# Patient Record
Sex: Female | Born: 1990 | Race: White | Hispanic: No | Marital: Single | Smoking: Current every day smoker
Health system: Southern US, Community
[De-identification: ages and names within clinical notes are randomized; demographics above are authoritative.]

## PROBLEM LIST (undated history)

## (undated) DIAGNOSIS — D649 Anemia, unspecified: Secondary | ICD-10-CM

---

## 2015-12-19 ENCOUNTER — Observation Stay (HOSPITAL_BASED_OUTPATIENT_CLINIC_OR_DEPARTMENT_OTHER)
Admission: EM | Admit: 2015-12-19 | Discharge: 2015-12-21 | DRG: 392 | Disposition: A | Discharge status: Home / Self Care | Payer: BLUE CROSS/BLUE SHIELD | Attending: Internal Medicine | Admitting: Internal Medicine

## 2015-12-19 ENCOUNTER — Encounter (HOSPITAL_BASED_OUTPATIENT_CLINIC_OR_DEPARTMENT_OTHER): Payer: Self-pay | Admitting: *Deleted

## 2015-12-19 ENCOUNTER — Emergency Department (HOSPITAL_BASED_OUTPATIENT_CLINIC_OR_DEPARTMENT_OTHER): Payer: BLUE CROSS/BLUE SHIELD

## 2015-12-19 DIAGNOSIS — N83201 Unspecified ovarian cyst, right side: Secondary | ICD-10-CM | POA: Diagnosis present

## 2015-12-19 DIAGNOSIS — F17200 Nicotine dependence, unspecified, uncomplicated: Secondary | ICD-10-CM | POA: Diagnosis present

## 2015-12-19 DIAGNOSIS — E876 Hypokalemia: Secondary | ICD-10-CM | POA: Diagnosis present

## 2015-12-19 DIAGNOSIS — N920 Excessive and frequent menstruation with regular cycle: Secondary | ICD-10-CM | POA: Diagnosis not present

## 2015-12-19 DIAGNOSIS — N390 Urinary tract infection, site not specified: Secondary | ICD-10-CM | POA: Diagnosis not present

## 2015-12-19 DIAGNOSIS — M545 Low back pain: Secondary | ICD-10-CM | POA: Diagnosis not present

## 2015-12-19 DIAGNOSIS — R109 Unspecified abdominal pain: Secondary | ICD-10-CM

## 2015-12-19 DIAGNOSIS — D509 Iron deficiency anemia, unspecified: Secondary | ICD-10-CM | POA: Diagnosis present

## 2015-12-19 DIAGNOSIS — D649 Anemia, unspecified: Secondary | ICD-10-CM

## 2015-12-19 DIAGNOSIS — K529 Noninfective gastroenteritis and colitis, unspecified: Principal | ICD-10-CM | POA: Diagnosis present

## 2015-12-19 DIAGNOSIS — K8020 Calculus of gallbladder without cholecystitis without obstruction: Secondary | ICD-10-CM | POA: Diagnosis present

## 2015-12-19 DIAGNOSIS — R1030 Lower abdominal pain, unspecified: Secondary | ICD-10-CM

## 2015-12-19 HISTORY — DX: Anemia, unspecified: D64.9

## 2015-12-19 LAB — CBC WITH DIFFERENTIAL/PLATELET
BASOS ABS: 0 10*3/uL (ref 0.0–0.1)
Basophils Relative: 0 %
EOS ABS: 0.1 10*3/uL (ref 0.0–0.7)
Eosinophils Relative: 1 %
HCT: 24.3 % — ABNORMAL LOW (ref 36.0–46.0)
HEMOGLOBIN: 6.4 g/dL — AB (ref 12.0–15.0)
LYMPHS ABS: 1.1 10*3/uL (ref 0.7–4.0)
Lymphocytes Relative: 15 %
MCH: 15.3 pg — AB (ref 26.0–34.0)
MCHC: 26.3 g/dL — AB (ref 30.0–36.0)
MCV: 58 fL — ABNORMAL LOW (ref 78.0–100.0)
Monocytes Absolute: 0.4 10*3/uL (ref 0.1–1.0)
Monocytes Relative: 6 %
NEUTROS ABS: 5.8 10*3/uL (ref 1.7–7.7)
Neutrophils Relative %: 78 %
PLATELETS: 468 10*3/uL — AB (ref 150–400)
RBC: 4.19 MIL/uL (ref 3.87–5.11)
RDW: 23.1 % — ABNORMAL HIGH (ref 11.5–15.5)
WBC: 7.4 10*3/uL (ref 4.0–10.5)

## 2015-12-19 LAB — COMPREHENSIVE METABOLIC PANEL
ALK PHOS: 44 U/L (ref 38–126)
ALT: 11 U/L — ABNORMAL LOW (ref 14–54)
ANION GAP: 11 (ref 5–15)
AST: 18 U/L (ref 15–41)
Albumin: 4.7 g/dL (ref 3.5–5.0)
BUN: 11 mg/dL (ref 6–20)
CALCIUM: 9.3 mg/dL (ref 8.9–10.3)
CO2: 26 mmol/L (ref 22–32)
Chloride: 100 mmol/L — ABNORMAL LOW (ref 101–111)
Creatinine, Ser: 0.57 mg/dL (ref 0.44–1.00)
Glucose, Bld: 103 mg/dL — ABNORMAL HIGH (ref 65–99)
Potassium: 2.9 mmol/L — ABNORMAL LOW (ref 3.5–5.1)
SODIUM: 137 mmol/L (ref 135–145)
Total Bilirubin: 0.5 mg/dL (ref 0.3–1.2)
Total Protein: 7.6 g/dL (ref 6.5–8.1)

## 2015-12-19 LAB — URINALYSIS, MICROSCOPIC (REFLEX)

## 2015-12-19 LAB — PREGNANCY, URINE: PREG TEST UR: NEGATIVE

## 2015-12-19 LAB — URINALYSIS, ROUTINE W REFLEX MICROSCOPIC
BILIRUBIN URINE: NEGATIVE
Glucose, UA: NEGATIVE mg/dL
HGB URINE DIPSTICK: NEGATIVE
KETONES UR: NEGATIVE mg/dL
Nitrite: NEGATIVE
PROTEIN: 30 mg/dL — AB
Specific Gravity, Urine: 1.026 (ref 1.005–1.030)
pH: 6 (ref 5.0–8.0)

## 2015-12-19 LAB — LIPASE, BLOOD: LIPASE: 15 U/L (ref 11–51)

## 2015-12-19 MED ORDER — ONDANSETRON HCL 4 MG/2ML IJ SOLN
4.0000 mg | Freq: Once | INTRAMUSCULAR | Status: DC
  Filled 2015-12-19: qty 2

## 2015-12-19 MED ORDER — DEXTROSE 5 % IV SOLN
1.0000 g | Freq: Once | INTRAVENOUS | Status: AC
  Administered 2015-12-19: 1 g via INTRAVENOUS
  Filled 2015-12-19: qty 10

## 2015-12-19 MED ORDER — MORPHINE SULFATE (PF) 4 MG/ML IV SOLN
4.0000 mg | Freq: Once | INTRAVENOUS | Status: AC
  Administered 2015-12-19: 4 mg via INTRAVENOUS
  Filled 2015-12-19: qty 1

## 2015-12-19 MED ORDER — ONDANSETRON 8 MG PO TBDP
8.0000 mg | ORAL_TABLET | Freq: Once | ORAL | Status: AC
  Administered 2015-12-19: 8 mg via ORAL

## 2015-12-19 MED ORDER — POTASSIUM CHLORIDE CRYS ER 20 MEQ PO TBCR
40.0000 meq | EXTENDED_RELEASE_TABLET | Freq: Once | ORAL | Status: AC
  Administered 2015-12-19: 40 meq via ORAL
  Filled 2015-12-19: qty 2

## 2015-12-19 MED ORDER — SODIUM CHLORIDE 0.9 % IV BOLUS (SEPSIS)
1000.0000 mL | Freq: Once | INTRAVENOUS | Status: AC
  Administered 2015-12-19: 1000 mL via INTRAVENOUS

## 2015-12-19 MED ORDER — ONDANSETRON 8 MG PO TBDP
ORAL_TABLET | ORAL | Status: AC
  Filled 2015-12-19: qty 1

## 2015-12-19 NOTE — ED Provider Notes (Signed)
[facility] Provider Note   CSN: [id number] Arrival date & time: 12/19/15  2137 By signing my name below, I, Bridgette HabermannMaria Tan, attest that this documentation has been prepared under the direction and in the presence of Linwood DibblesJon Kobe Jansma, MD. Electronically Signed: Bridgette HabermannMaria Tan, ED Scribe. 12/19/15. 10:29 PM.  History   Chief Complaint Chief Complaint  Patient presents with  . Abdominal Pain   HPI Comments: Kelsey Park is a 25 y.o. female with h/o anemia who presents to the Emergency Department complaining of abdominal pain onset 4 days ago with associated nausea, vomiting, and lower back pain. Pt states she had diarrhea the first two days and notes that it has resolved at this time. Pain is exacerbated with movement and improved when lying still. Pt took an Advil 200mg  one hour ago with minimal relief. Denies h/o similar symptoms or any suspicious food activity. Pt denies vaginal bleeding/discharge, dysuria, hematuria, fever, chills, or any other associated symptoms.   The history is provided by the patient. No language interpreter was used.    Past Medical History:  Diagnosis Date  . Anemia     There are no active problems to display for this patient.   History reviewed. No pertinent surgical history.  OB History    No data available       Home Medications    Prior to Admission medications   Not on File    Family History History reviewed. No pertinent family history.  Social History Social History  Substance Use Topics  . Smoking status: Current Every Day Smoker  . Smokeless tobacco: Never Used  . Alcohol use Yes     Allergies   Patient has no known allergies.   Review of Systems Review of Systems  Constitutional: Negative for chills and fever.  Gastrointestinal: Positive for abdominal pain, nausea and vomiting. Negative for diarrhea.  Genitourinary: Positive for menstrual problem (heavy menses history). Negative for dysuria and hematuria.  Musculoskeletal:  Positive for back pain.  All other systems reviewed and are negative.    Physical Exam Updated Vital Signs BP 133/75 (BP Location: Left Arm)   Pulse 80   Temp 98 F (36.7 C) (Oral)   Resp 18   Ht 5' 3.5" (1.613 m)   Wt 46.6 kg   LMP 11/24/2015 (Approximate)   SpO2 100%   BMI 17.92 kg/m   Physical Exam  Constitutional: She appears well-developed and well-nourished. No distress.  HENT:  Head: Normocephalic and atraumatic.  Right Ear: External ear normal.  Left Ear: External ear normal.  Eyes: Conjunctivae are normal. Right eye exhibits no discharge. Left eye exhibits no discharge. No scleral icterus.  Neck: Neck supple. No tracheal deviation present.  Cardiovascular: Normal rate, regular rhythm and intact distal pulses.   Pulmonary/Chest: Effort normal and breath sounds normal. No stridor. No respiratory distress. She has no wheezes. She has no rales.  Abdominal: Soft. Bowel sounds are normal. She exhibits no distension. There is tenderness in the left lower quadrant. There is guarding. There is no rebound.  Musculoskeletal: She exhibits no edema or tenderness.  Neurological: She is alert. She has normal strength. No cranial nerve deficit (no facial droop, extraocular movements intact, no slurred speech) or sensory deficit. She exhibits normal muscle tone. She displays no seizure activity. Coordination normal.  Skin: Skin is warm and dry. No rash noted. There is pallor.  Psychiatric: She has a normal mood and affect.  Nursing note and vitals reviewed.    ED Treatments / Results  DIAGNOSTIC STUDIES: Oxygen Saturation is 100% on RA, normal by my interpretation.    COORDINATION OF CARE: 10:29 PM Discussed treatment plan with pt at bedside which includes lab work and IV fluids and pt agreed to plan.  Labs (all labs ordered are listed, but only abnormal results are displayed) Labs Reviewed  URINALYSIS, ROUTINE W REFLEX MICROSCOPIC - Abnormal; Notable for the following:        Result Value   Color, Urine AMBER (*)    APPearance CLOUDY (*)    Protein, ur 30 (*)    Leukocytes, UA LARGE (*)    All other components within normal limits  CBC WITH DIFFERENTIAL/PLATELET - Abnormal; Notable for the following:    Hemoglobin 6.4 (*)    HCT 24.3 (*)    MCV 58.0 (*)    MCH 15.3 (*)    MCHC 26.3 (*)    RDW 23.1 (*)    Platelets 468 (*)    All other components within normal limits  COMPREHENSIVE METABOLIC PANEL - Abnormal; Notable for the following:    Potassium 2.9 (*)    Chloride 100 (*)    Glucose, Bld 103 (*)    ALT 11 (*)    All other components within normal limits  URINALYSIS, MICROSCOPIC (REFLEX) - Abnormal; Notable for the following:    Bacteria, UA FEW (*)    Squamous Epithelial / LPF 0-5 (*)    All other components within normal limits  PREGNANCY, URINE  LIPASE, BLOOD  OCCULT BLOOD X 1 CARD TO LAB, STOOL    EKG  EKG Interpretation None       Radiology No results found.  Procedures Procedures (including critical care time)  Medications Ordered in ED Medications  ondansetron (ZOFRAN) injection 4 mg (not administered)  potassium chloride SA (K-DUR,KLOR-CON) CR tablet 40 mEq (not administered)  cefTRIAXone (ROCEPHIN) 1 g in dextrose 5 % 50 mL IVPB (not administered)  ondansetron (ZOFRAN-ODT) disintegrating tablet 8 mg (8 mg Oral Given 12/19/15 2209)  sodium chloride 0.9 % bolus 1,000 mL (1,000 mLs Intravenous New Bag/Given 12/19/15 2246)  morphine 4 MG/ML injection 4 mg (4 mg Intravenous Given 12/19/15 2246)     Initial Impression / Assessment and Plan / ED Course  I have reviewed the triage vital signs and the nursing notes.  Pertinent labs & imaging results that were available during my care of the patient were reviewed by me and considered in my medical decision making (see chart for details).  Clinical Course as of Dec 18 2348  Wynelle LinkSun Dec 19, 2015  2319 Labs show a significant anemia.  Pt has been told she has anemia and is supposed  to be taking iron but has not because of the side effects.  [JK]  2330 Pt has had hemoglobins as low as 6 in the past.  She has received blood transfusions because of this.  [JK]    Clinical Course User Index [JK] Linwood DibblesJon Raha Tennison, MD    Pt has profound anemia. Suspect this is more chronic but with her abdominal pain will CT to evaluate for acute pathology/hemorrhage/stone.    UTI also noted which may be her source of abdominal pain.     Will check fecal occult.  Replace oral potassium.  Will likely need transfer for admission, blood transfusion.  Case turned over to Dr Bebe ShaggyWickline  Final Clinical Impressions(s) / ED Diagnoses   Final diagnoses:  Anemia, unspecified type  Hypokalemia  Urinary tract infection without hematuria, site unspecified   I  personally performed the services described in this documentation, which was scribed in my presence.  The recorded information has been reviewed and is accurate.    Linwood Dibbles, MD 12/19/15 [phone]

## 2015-12-19 NOTE — ED Notes (Signed)
MD does not want any blood cultures to be collected at this time.

## 2015-12-19 NOTE — ED Triage Notes (Addendum)
C/o abd pain, onset Thursday, (denies: urinary or vaginal sx, or fever), also reports nvd and some constipatiton, last BM 2d ago, vomiting x4 in last 24 hrs, diarrhea resolved. Took advil 200mg  1 hr ago. Also mentions back pain.

## 2015-12-19 NOTE — ED Notes (Signed)
MD aware Hgb is 6.4

## 2015-12-20 ENCOUNTER — Observation Stay (HOSPITAL_COMMUNITY): Payer: BLUE CROSS/BLUE SHIELD

## 2015-12-20 ENCOUNTER — Encounter (HOSPITAL_COMMUNITY): Payer: Self-pay | Admitting: Internal Medicine

## 2015-12-20 DIAGNOSIS — D509 Iron deficiency anemia, unspecified: Secondary | ICD-10-CM | POA: Diagnosis not present

## 2015-12-20 DIAGNOSIS — E876 Hypokalemia: Secondary | ICD-10-CM

## 2015-12-20 DIAGNOSIS — D649 Anemia, unspecified: Secondary | ICD-10-CM

## 2015-12-20 DIAGNOSIS — K529 Noninfective gastroenteritis and colitis, unspecified: Principal | ICD-10-CM

## 2015-12-20 LAB — BASIC METABOLIC PANEL
Anion gap: 7 (ref 5–15)
BUN: 5 mg/dL — AB (ref 6–20)
CALCIUM: 8.3 mg/dL — AB (ref 8.9–10.3)
CO2: 25 mmol/L (ref 22–32)
CREATININE: 0.57 mg/dL (ref 0.44–1.00)
Chloride: 104 mmol/L (ref 101–111)
Glucose, Bld: 91 mg/dL (ref 65–99)
Potassium: 4 mmol/L (ref 3.5–5.1)
SODIUM: 136 mmol/L (ref 135–145)

## 2015-12-20 LAB — IRON AND TIBC
Iron: 6 ug/dL — ABNORMAL LOW (ref 28–170)
SATURATION RATIOS: 1 % — AB (ref 10.4–31.8)
TIBC: 433 ug/dL (ref 250–450)
UIBC: 427 ug/dL

## 2015-12-20 LAB — CBC
HEMATOCRIT: 22.2 % — AB (ref 36.0–46.0)
MCH: 15.2 pg — AB (ref 26.0–34.0)
MCHC: 25.7 g/dL — ABNORMAL LOW (ref 30.0–36.0)
MCV: 59 fL — AB (ref 78.0–100.0)
Platelets: 306 10*3/uL (ref 150–400)
RBC: 3.76 MIL/uL — AB (ref 3.87–5.11)
RDW: 22.1 % — ABNORMAL HIGH (ref 11.5–15.5)
WBC: 6.7 10*3/uL (ref 4.0–10.5)

## 2015-12-20 LAB — FERRITIN: Ferritin: 1 ng/mL — ABNORMAL LOW (ref 11–307)

## 2015-12-20 LAB — VITAMIN B12: VITAMIN B 12: 328 pg/mL (ref 180–914)

## 2015-12-20 LAB — ABO/RH: ABO/RH(D): A POS

## 2015-12-20 LAB — RETICULOCYTES
RBC.: 3.83 MIL/uL — AB (ref 3.87–5.11)
RETIC CT PCT: 0.6 % (ref 0.4–3.1)
Retic Count, Absolute: 23 10*3/uL (ref 19.0–186.0)

## 2015-12-20 LAB — FOLATE: FOLATE: 14.2 ng/mL (ref >5.9)

## 2015-12-20 LAB — PREPARE RBC (CROSSMATCH)

## 2015-12-20 LAB — MAGNESIUM: Magnesium: 2.1 mg/dL (ref 1.7–2.4)

## 2015-12-20 MED ORDER — ACETAMINOPHEN 650 MG RE SUPP: 650.0000 mg | Freq: Four times a day (QID) | RECTAL | Status: DC | PRN

## 2015-12-20 MED ORDER — POTASSIUM CHLORIDE CRYS ER 20 MEQ PO TBCR
40.0000 meq | EXTENDED_RELEASE_TABLET | Freq: Once | ORAL | Status: AC
  Administered 2015-12-20: 40 meq via ORAL
  Filled 2015-12-20: qty 2

## 2015-12-20 MED ORDER — MORPHINE SULFATE (PF) 2 MG/ML IV SOLN
2.0000 mg | Freq: Once | INTRAVENOUS | Status: AC
  Administered 2015-12-20: 2 mg via INTRAVENOUS
  Filled 2015-12-20: qty 1

## 2015-12-20 MED ORDER — POTASSIUM CHLORIDE IN NACL 20-0.9 MEQ/L-% IV SOLN
INTRAVENOUS | Status: AC
  Administered 2015-12-20 – 2015-12-21 (×2): via INTRAVENOUS
  Filled 2015-12-20 (×2): qty 1000

## 2015-12-20 MED ORDER — ONDANSETRON HCL 4 MG/2ML IJ SOLN: 4.0000 mg | Freq: Four times a day (QID) | INTRAMUSCULAR | Status: DC | PRN

## 2015-12-20 MED ORDER — ONDANSETRON HCL 4 MG PO TABS: 4.0000 mg | ORAL_TABLET | Freq: Four times a day (QID) | ORAL | Status: DC | PRN

## 2015-12-20 MED ORDER — SODIUM CHLORIDE 0.9 % IV SOLN
510.0000 mg | Freq: Once | INTRAVENOUS | Status: AC
  Administered 2015-12-20: 510 mg via INTRAVENOUS
  Filled 2015-12-20: qty 17

## 2015-12-20 MED ORDER — IOPAMIDOL (ISOVUE-300) INJECTION 61%
100.0000 mL | Freq: Once | INTRAVENOUS | Status: AC | PRN
  Administered 2015-12-20: 100 mL via INTRAVENOUS

## 2015-12-20 MED ORDER — HYDROCODONE-ACETAMINOPHEN 5-325 MG PO TABS
2.0000 | ORAL_TABLET | Freq: Once | ORAL | Status: AC
  Administered 2015-12-20: 2 via ORAL
  Filled 2015-12-20: qty 2

## 2015-12-20 MED ORDER — MORPHINE SULFATE (PF) 2 MG/ML IV SOLN
INTRAVENOUS | Status: AC
  Filled 2015-12-20: qty 1

## 2015-12-20 MED ORDER — TECHNETIUM TC 99M MEBROFENIN IV KIT
5.2000 | PACK | Freq: Once | INTRAVENOUS | Status: AC | PRN
  Administered 2015-12-20: 5.2 via INTRAVENOUS

## 2015-12-20 MED ORDER — SODIUM CHLORIDE 0.9 % IV SOLN: Freq: Once | INTRAVENOUS | Status: DC

## 2015-12-20 MED ORDER — PIPERACILLIN-TAZOBACTAM 3.375 G IVPB
3.3750 g | Freq: Three times a day (TID) | INTRAVENOUS | Status: DC
  Administered 2015-12-20 – 2015-12-21 (×3): 3.375 g via INTRAVENOUS
  Filled 2015-12-20 (×6): qty 50

## 2015-12-20 MED ORDER — ACETAMINOPHEN 325 MG PO TABS: 650.0000 mg | ORAL_TABLET | Freq: Four times a day (QID) | ORAL | Status: DC | PRN

## 2015-12-20 NOTE — ED Provider Notes (Signed)
Pt stable Plan to perform CT a/p Then will need admission for blood transfusion She has had transfusions previously and she has had no issues previously    Kelsey Rhineonald Arely Tinner, MD 12/20/15 0030

## 2015-12-20 NOTE — ED Notes (Signed)
Discussed with pt orders for hemoccult. Pt states she does not want to have this done at this time.

## 2015-12-20 NOTE — Progress Notes (Signed)
25 yo F with hx anemia presents with abdominal pain, n/v/d and back pain.  Dx'd here with gastroenteritis and cystitis.  Found incidentally to have severe anemia and hypokalemia.  No outside records immediately available, but patient reports history of anemia from menorrhagia.  BP (!) 108/52   Pulse 72   Temp 98 F (36.7 C) (Oral)   Resp 18   Ht 5' 3.5" (1.613 m)   Wt 46.6 kg (102 lb 12.8 oz)   LMP 11/24/2015 (Approximate)   SpO2 100%   BMI 17.92 kg/m   WBC  7.4K, Hgb 6.4 (baseline unknown), severe microcytosis K 2.9 CT shows several nonspecific findings ileitis, ovarian cyst, gallstones without cholecystitis.  Given 1L, K 40 PO, and ceftriaxone for cystitis/UTI.  [facility] asked to observe and give blood, replace K.  To med surg bed.

## 2015-12-20 NOTE — ED Notes (Signed)
Water given per order. 

## 2015-12-20 NOTE — ED Provider Notes (Signed)
Ct results noted Pt stable Will admit D/w dr danford for admission    Kelsey Park WicklZadie Rhineine, MD 12/20/15 [phone]

## 2015-12-20 NOTE — Progress Notes (Signed)
Pharmacy Antibiotic Note  Georgina Karie Mainlandli is a 25 y.o. female admitted on 12/19/2015 with intra-abdominal infection.  Pharmacy has been consulted for Zosyn dosing. Pt here with abdominal pain, N/V/D, back pain. WBC WNL. Renal function good. CT with possible enteritis.   Plan: Zosyn 3.375G IV q8h to be infused over 4 hours Trend WBC, temp, renal function  F/U infectious work-up  Height: 5' 3.5" (161.3 cm) Weight: 102 lb 12.8 oz (46.6 kg) IBW/kg (Calculated) : 53.55  Temp (24hrs), Avg:98.1 F (36.7 C), Min:97.9 F (36.6 C), Max:98.2 F (36.8 C)   Recent Labs Lab 12/19/15 2245  WBC 7.4  CREATININE 0.57    Estimated Creatinine Clearance: 79.1 mL/min (by C-G formula based on SCr of 0.57 mg/dL).    No Known Allergies   Abran DukeLedford, Avalie Oconnor 12/20/2015 5:41 AM

## 2015-12-20 NOTE — Progress Notes (Signed)
PROGRESS NOTE    Kelsey Park  ZOX:[id number] DOB: 11/14/1990 DOA: 12/19/2015 PCP: No primary care provider on file.   Outpatient Specialists:     Brief Narrative:  Kelsey Hansenman Agro is a 25 y.o. female with history of anemia requiring transfusion previously attributed to severe menorrhagia presents to the ER because of worsening abdominal pain with vomiting. Patient states her symptoms started around 4-5 days ago with nausea vomiting abdominal pain and diarrhea. Diarrhea stopped after the first day. Denies using any recent antibiotics or sick contacts. Patient had continued abdominal pain mostly in the left lower quadrant with vomiting. Pain radiated to her back. In the ER patient was found to have hypokalemia and microcytic hypo-chromic anemia with hemoglobin around 6. No old labs to compare. CT scan of the abdomen and pelvis was done which shows enteritis involving the terminal ileum with gallstones and right ovarian hemorrhagic cyst or corpus luteum. Patient is being admitted for further management.    Assessment & Plan:   Principal Problem:   Enteritis Active Problems:   Microcytic hypochromic anemia   Hypokalemia   Abdominal pain with nausea vomiting -  -CT scan shows enteritis involving the terminal ileum.  -infectious etiology versus inflammatory bowel disease- stool studies pending -CT scan shows gallstones- HIDA scan.  -Pain could also be from the right hemorrhagic ovarian cyst.  Hypokalemia probably from vomiting and poor oral intake - replace and recheck.  Microcytic hypochromic anemia -  -IV fe x 1 -transfuse 1 unit PRBC  Tobacco abuse - advised about quitting.   DVT prophylaxis:  SCD's  Code Status: Full Code   Family Communication:   Disposition Plan:     Consultants:        Subjective: No further N/V  Objective: Vitals:   12/20/15 0330 12/20/15 0345 12/20/15 0346 12/20/15 0435  BP: 123/58 121/63 121/63 110/63  Pulse: 71  (!) 58 65  Resp:   20 18    Temp:   98.1 F (36.7 C) 98.2 F (36.8 C)  TempSrc:   Oral Oral  SpO2: 100%  100% 100%  Weight:      Height:        Intake/Output Summary (Last 24 hours) at 12/20/15 1020 Last data filed at 12/20/15 0301  Gross per 24 hour  Intake             1000 ml  Output                0 ml  Net             1000 ml   Filed Weights   12/19/15 2139  Weight: 46.6 kg (102 lb 12.8 oz)    Examination:  General exam: Appears calm and comfortable  Respiratory system: Clear to auscultation. Respiratory effort normal. Cardiovascular system: S1 & S2 heard, RRR. No JVD, murmurs, rubs, gallops or clicks. No pedal edema. Gastrointestinal system: Abdomen is nondistended, soft and nontender. No organomegaly or masses felt. Normal bowel sounds heard. Central nervous system: Alert and oriented. No focal neurological deficits.      Data Reviewed: I have personally reviewed following labs and imaging studies  CBC:  Recent Labs Lab 12/19/15 2245  WBC 7.4  NEUTROABS 5.8  HGB 6.4*  HCT 24.3*  MCV 58.0*  PLT 468*   Basic Metabolic Panel:  Recent Labs Lab 12/19/15 2245 12/20/15 0814  NA 137  --   K 2.9*  --   CL 100*  --   CO2 26  --  GLUCOSE 103*  --   BUN 11  --   CREATININE 0.57  --   CALCIUM 9.3  --   MG  --  2.1   GFR: Estimated Creatinine Clearance: 79.1 mL/min (by C-G formula based on SCr of 0.57 mg/dL). Liver Function Tests:  Recent Labs Lab 12/19/15 2245  AST 18  ALT 11*  ALKPHOS 44  BILITOT 0.5  PROT 7.6  ALBUMIN 4.7    Recent Labs Lab 12/19/15 2245  LIPASE 15   No results for input(s): AMMONIA in the last 168 hours. Coagulation Profile: No results for input(s): INR, PROTIME in the last 168 hours. Cardiac Enzymes: No results for input(s): CKTOTAL, CKMB, CKMBINDEX, TROPONINI in the last 168 hours. BNP (last 3 results) No results for input(s): PROBNP in the last 8760 hours. HbA1C: No results for input(s): HGBA1C in the last 72 hours. CBG: No results  for input(s): GLUCAP in the last 168 hours. Lipid Profile: No results for input(s): CHOL, HDL, LDLCALC, TRIG, CHOLHDL, LDLDIRECT in the last 72 hours. Thyroid Function Tests: No results for input(s): TSH, T4TOTAL, FREET4, T3FREE, THYROIDAB in the last 72 hours. Anemia Panel:  Recent Labs  12/20/15 0814  VITAMINB12 328  FOLATE 14.2  FERRITIN 1*  TIBC 433  IRON 6*  RETICCTPCT 0.6   Urine analysis:    Component Value Date/Time   COLORURINE AMBER (A) 12/19/2015 2230   APPEARANCEUR CLOUDY (A) 12/19/2015 2230   LABSPEC 1.026 12/19/2015 2230   PHURINE 6.0 12/19/2015 2230   GLUCOSEU NEGATIVE 12/19/2015 2230   HGBUR NEGATIVE 12/19/2015 2230   BILIRUBINUR NEGATIVE 12/19/2015 2230   KETONESUR NEGATIVE 12/19/2015 2230   PROTEINUR 30 (A) 12/19/2015 2230   NITRITE NEGATIVE 12/19/2015 2230   LEUKOCYTESUR LARGE (A) 12/19/2015 2230     )No results found for this or any previous visit (from the past 240 hour(s)).    Anti-infectives    Start     Dose/Rate Route Frequency Ordered Stop   12/20/15 0545  piperacillin-tazobactam (ZOSYN) IVPB 3.375 g     3.375 g 12.5 mL/hr over 240 Minutes Intravenous Every 8 hours 12/20/15 0544     12/20/15 0000  cefTRIAXone (ROCEPHIN) 1 g in dextrose 5 % 50 mL IVPB     1 g 100 mL/hr over 30 Minutes Intravenous  Once 12/19/15 2348 12/20/15 0040       Radiology Studies: Ct Abdomen Pelvis W Contrast  Result Date: 12/20/2015 CLINICAL DATA:  Abdominal pain onset 4 days ago with nausea, vomiting and lower back pain. EXAM: CT ABDOMEN AND PELVIS WITH CONTRAST TECHNIQUE: Multidetector CT imaging of the abdomen and pelvis was performed using the standard protocol following bolus administration of intravenous contrast. CONTRAST:  ISOVUE-300 IOPAMIDOL (ISOVUE-300) INJECTION 61% COMPARISON:  None. FINDINGS: LOWER CHEST: Lung bases are clear. Included heart size is normal. No pericardial effusion. HEPATOBILIARY: Mild periportal edema. Trace pericholecystic  fluid without wall thickening. There appear to be 2 tiny gallstones seen on the sagittal view along the dependent aspect, series 6, image 35. No biliary dilatation. PANCREAS: Normal. SPLEEN: Normal. ADRENALS/URINARY TRACT: Kidneys are orthotopic, demonstrating symmetric enhancement. No nephrolithiasis, hydronephrosis or solid renal masses. The unopacified ureters are normal in course and caliber. Delayed imaging through the kidneys demonstrates symmetric prompt contrast excretion within the proximal urinary collecting system. Urinary bladder is partially distended and unremarkable. Normal adrenal glands. STOMACH/BOWEL: The stomach, small and large bowel are normal in course. Mild fluid-filled distal ileal loops may represent mild ileitis/enteritis. Moderate colonic stool burden from cecum  through mid transverse colon. No bowel obstruction. Normal appendix. VASCULAR/LYMPHATIC: Aortoiliac vessels are normal in course and caliber. No lymphadenopathy by CT size criteria. REPRODUCTIVE: 2.4 cm enhancing right-sided follicle or cyst possibly a hemorrhagic cyst or corpus luteum. OTHER: Trace free fluid in the pelvis.  No free air. MUSCULOSKELETAL: Nonacute. IMPRESSION: Mild fluid-filled distention of distal and terminal ileum which may reflect a mild enteritis/ ileitis. Cholelithiasis with trace pericholecystic fluid but without wall thickening. Mild periportal edema. If cholecystitis is of clinical concern, HIDA scan may help for further assessment. Right ovarian hemorrhagic cyst or corpus luteum measuring 2.4 cm with trace free fluid in the pelvis. Electronically Signed   By: Tollie Ethavid  Kwon M.D.   On: 12/20/2015 02:28        Scheduled Meds: . sodium chloride   Intravenous Once  . ferumoxytol  510 mg Intravenous Once  . morphine      . ondansetron  4 mg Intravenous Once  . piperacillin-tazobactam (ZOSYN)  IV  3.375 g Intravenous Q8H   Continuous Infusions: . 0.9 % NaCl with KCl 20 mEq / L 75 mL/hr at 12/20/15  0611     LOS: 0 days    Time spent: 35 min    JESSICA U VANN, DO [facility] Pager [phone]  If 7PM-7AM, please contact night-coverage [email or web address] Password [email or web address] 12/20/2015, 10:20 AM

## 2015-12-20 NOTE — ED Notes (Signed)
Patient transported to CT 

## 2015-12-20 NOTE — H&P (Signed)
History and Physical    Kelsey Park WUJ:[id number] DOB: 03/14/1990 DOA: 12/19/2015  PCP: No primary care provider on file.  Patient coming from: Home.  Chief Complaint: Abdominal pain with nausea vomiting.  HPI: Kelsey Park is a 25 y.o. female with history of anemia requiring transfusion previously attributed to severe menorrhagia presents to the ER because of worsening abdominal pain with vomiting. Patient states her symptoms started around 4-5 days ago with nausea vomiting abdominal pain and diarrhea. Diarrhea stopped after the first day. Denies using any recent antibiotics or sick contacts. Patient had continued abdominal pain mostly in the left lower quadrant with vomiting. Pain radiated to her back. In the ER patient was found to have hypokalemia and microcytic hypo-chromic anemia with hemoglobin around 6. No old labs to compare. CT scan of the abdomen and pelvis was done which shows enteritis involving the terminal ileum with gallstones and right ovarian hemorrhagic cyst or corpus luteum. Patient is being admitted for further management.   ED Course: CT scan as described above. Patient was given potassium replacement.  Review of Systems: As per HPI, rest all negative.   Past Medical History:  Diagnosis Date  . Anemia     History reviewed. No pertinent surgical history.   reports that she has been smoking.  She has never used smokeless tobacco. She reports that she drinks alcohol. She reports that she uses drugs, including Marijuana.  No Known Allergies  Family History  Problem Relation Age of Onset  . Hypertension Mother   . Hyperlipidemia Mother   . Thalassemia Sister     Prior to Admission medications   Not on File    Physical Exam: Vitals:   12/20/15 0330 12/20/15 0345 12/20/15 0346 12/20/15 0435  BP: 123/58 121/63 121/63 110/63  Pulse: 71  (!) 58 65  Resp:   20 18  Temp:   98.1 F (36.7 C) 98.2 F (36.8 C)  TempSrc:   Oral Oral  SpO2: 100%  100% 100%  Weight:       Height:          Constitutional: Moderately built and nourished. Vitals:   12/20/15 0330 12/20/15 0345 12/20/15 0346 12/20/15 0435  BP: 123/58 121/63 121/63 110/63  Pulse: 71  (!) 58 65  Resp:   20 18  Temp:   98.1 F (36.7 C) 98.2 F (36.8 C)  TempSrc:   Oral Oral  SpO2: 100%  100% 100%  Weight:      Height:       Eyes: Anicteric pallor present. ENMT: No discharge from the ears eyes nose and mouth. Neck: No mass no JVD appreciated. Respiratory: No rhonchi or crepitations. Cardiovascular: S1-S2 heard no murmurs appreciated. Abdomen: Soft nontender bowel sounds present no guarding or rigidity. Musculoskeletal: No edema. No joint effusion. Skin: No rash. Skin is warm. Neurologic: Alert awake oriented to time place and person. Moves all extremities. Psychiatric: Appears normal. Normal affect.   Labs on Admission: I have personally reviewed following labs and imaging studies  CBC:  Recent Labs Lab 12/19/15 2245  WBC 7.4  NEUTROABS 5.8  HGB 6.4*  HCT 24.3*  MCV 58.0*  PLT 468*   Basic Metabolic Panel:  Recent Labs Lab 12/19/15 2245  NA 137  K 2.9*  CL 100*  CO2 26  GLUCOSE 103*  BUN 11  CREATININE 0.57  CALCIUM 9.3   GFR: Estimated Creatinine Clearance: 79.1 mL/min (by C-G formula based on SCr of 0.57 mg/dL). Liver Function Tests:  Recent  Labs Lab 12/19/15 2245  AST 18  ALT 11*  ALKPHOS 44  BILITOT 0.5  PROT 7.6  ALBUMIN 4.7    Recent Labs Lab 12/19/15 2245  LIPASE 15   No results for input(s): AMMONIA in the last 168 hours. Coagulation Profile: No results for input(s): INR, PROTIME in the last 168 hours. Cardiac Enzymes: No results for input(s): CKTOTAL, CKMB, CKMBINDEX, TROPONINI in the last 168 hours. BNP (last 3 results) No results for input(s): PROBNP in the last 8760 hours. HbA1C: No results for input(s): HGBA1C in the last 72 hours. CBG: No results for input(s): GLUCAP in the last 168 hours. Lipid Profile: No results for  input(s): CHOL, HDL, LDLCALC, TRIG, CHOLHDL, LDLDIRECT in the last 72 hours. Thyroid Function Tests: No results for input(s): TSH, T4TOTAL, FREET4, T3FREE, THYROIDAB in the last 72 hours. Anemia Panel: No results for input(s): VITAMINB12, FOLATE, FERRITIN, TIBC, IRON, RETICCTPCT in the last 72 hours. Urine analysis:    Component Value Date/Time   COLORURINE AMBER (A) 12/19/2015 2230   APPEARANCEUR CLOUDY (A) 12/19/2015 2230   LABSPEC 1.026 12/19/2015 2230   PHURINE 6.0 12/19/2015 2230   GLUCOSEU NEGATIVE 12/19/2015 2230   HGBUR NEGATIVE 12/19/2015 2230   BILIRUBINUR NEGATIVE 12/19/2015 2230   KETONESUR NEGATIVE 12/19/2015 2230   PROTEINUR 30 (A) 12/19/2015 2230   NITRITE NEGATIVE 12/19/2015 2230   LEUKOCYTESUR LARGE (A) 12/19/2015 2230   Sepsis Labs: @LABRCNTIP (procalcitonin:4,lacticidven:4) )No results found for this or any previous visit (from the past 240 hour(s)).   Radiological Exams on Admission: Ct Abdomen Pelvis W Contrast  Result Date: 12/20/2015 CLINICAL DATA:  Abdominal pain onset 4 days ago with nausea, vomiting and lower back pain. EXAM: CT ABDOMEN AND PELVIS WITH CONTRAST TECHNIQUE: Multidetector CT imaging of the abdomen and pelvis was performed using the standard protocol following bolus administration of intravenous contrast. CONTRAST:  ISOVUE-300 IOPAMIDOL (ISOVUE-300) INJECTION 61% COMPARISON:  None. FINDINGS: LOWER CHEST: Lung bases are clear. Included heart size is normal. No pericardial effusion. HEPATOBILIARY: Mild periportal edema. Trace pericholecystic fluid without wall thickening. There appear to be 2 tiny gallstones seen on the sagittal view along the dependent aspect, series 6, image 35. No biliary dilatation. PANCREAS: Normal. SPLEEN: Normal. ADRENALS/URINARY TRACT: Kidneys are orthotopic, demonstrating symmetric enhancement. No nephrolithiasis, hydronephrosis or solid renal masses. The unopacified ureters are normal in course and caliber. Delayed  imaging through the kidneys demonstrates symmetric prompt contrast excretion within the proximal urinary collecting system. Urinary bladder is partially distended and unremarkable. Normal adrenal glands. STOMACH/BOWEL: The stomach, small and large bowel are normal in course. Mild fluid-filled distal ileal loops may represent mild ileitis/enteritis. Moderate colonic stool burden from cecum through mid transverse colon. No bowel obstruction. Normal appendix. VASCULAR/LYMPHATIC: Aortoiliac vessels are normal in course and caliber. No lymphadenopathy by CT size criteria. REPRODUCTIVE: 2.4 cm enhancing right-sided follicle or cyst possibly a hemorrhagic cyst or corpus luteum. OTHER: Trace free fluid in the pelvis.  No free air. MUSCULOSKELETAL: Nonacute. IMPRESSION: Mild fluid-filled distention of distal and terminal ileum which may reflect a mild enteritis/ ileitis. Cholelithiasis with trace pericholecystic fluid but without wall thickening. Mild periportal edema. If cholecystitis is of clinical concern, HIDA scan may help for further assessment. Right ovarian hemorrhagic cyst or corpus luteum measuring 2.4 cm with trace free fluid in the pelvis. Electronically Signed   By: Tollie Eth M.D.   On: 12/20/2015 02:28     Assessment/Plan Principal Problem:   Enteritis Active Problems:   Microcytic hypochromic anemia  Hypokalemia    1. Abdominal pain with nausea vomiting - CT scan shows enteritis involving the terminal ileum. Differentials include infectious etiology versus inflammatory bowel disease. For now I'm placing patient on Zosyn check stool studies. If symptoms persist consult gastroenterologist. Since CT scan shows gallstones I have ordered a HIDA scan. Pain could also be from the hemorrhagic ovarian cyst. 2. Hypokalemia probably from vomiting and poor oral intake - replace and recheck. Check magnesium levels. 3. Microcytic hypochromic anemia - check anemia panel. Transfuse 1 unit of PRBC. Anemia  probably secondary to menorrhagia. 4. Tobacco abuse - advised about quitting.   DVT prophylaxis: SCDs. Code Status: Full code.  Family Communication: Discussed with patient.  Disposition Plan: Home.  Consults called: None.  Admission status: Observation.    Eduard ClosKAKRAKANDY,Kendyl Bissonnette N. MD [facility] Pager [phone].  If 7PM-7AM, please contact night-coverage [email or web address] Password [email or web address]  12/20/2015, 5:34 AM

## 2015-12-21 DIAGNOSIS — D509 Iron deficiency anemia, unspecified: Secondary | ICD-10-CM | POA: Diagnosis not present

## 2015-12-21 DIAGNOSIS — K529 Noninfective gastroenteritis and colitis, unspecified: Secondary | ICD-10-CM | POA: Diagnosis not present

## 2015-12-21 DIAGNOSIS — E876 Hypokalemia: Secondary | ICD-10-CM | POA: Diagnosis not present

## 2015-12-21 LAB — BASIC METABOLIC PANEL
Anion gap: 6 (ref 5–15)
BUN: <5 mg/dL — ABNORMAL LOW (ref 6–20)
CALCIUM: 8.9 mg/dL (ref 8.9–10.3)
CO2: 26 mmol/L (ref 22–32)
CREATININE: 0.67 mg/dL (ref 0.44–1.00)
Chloride: 104 mmol/L (ref 101–111)
GFR calc Af Amer: >60 mL/min (ref >60)
Glucose, Bld: 83 mg/dL (ref 65–99)
Potassium: 3.8 mmol/L (ref 3.5–5.1)
SODIUM: 136 mmol/L (ref 135–145)

## 2015-12-21 LAB — CBC
HCT: 26 % — ABNORMAL LOW (ref 36.0–46.0)
Hemoglobin: 7.1 g/dL — ABNORMAL LOW (ref 12.0–15.0)
MCH: 16.7 pg — ABNORMAL LOW (ref 26.0–34.0)
MCHC: 27.3 g/dL — AB (ref 30.0–36.0)
MCV: 61 fL — ABNORMAL LOW (ref 78.0–100.0)
PLATELETS: 334 10*3/uL (ref 150–400)
RBC: 4.26 MIL/uL (ref 3.87–5.11)
RDW: 24 % — AB (ref 11.5–15.5)
WBC: 5.1 10*3/uL (ref 4.0–10.5)

## 2015-12-21 LAB — TYPE AND SCREEN
Blood Product Expiration Date: 201712152359
ISSUE DATE / TIME: 201712111348
UNIT TYPE AND RH: 600

## 2015-12-21 MED ORDER — METRONIDAZOLE 500 MG PO TABS
500.0000 mg | ORAL_TABLET | Freq: Three times a day (TID) | ORAL | Status: DC
  Administered 2015-12-21: 500 mg via ORAL
  Filled 2015-12-21: qty 1

## 2015-12-21 MED ORDER — METRONIDAZOLE 500 MG PO TABS: 500.0000 mg | ORAL_TABLET | Freq: Three times a day (TID) | ORAL | Status: DC

## 2015-12-21 MED ORDER — METRONIDAZOLE 500 MG PO TABS: 500.0000 mg | ORAL_TABLET | Freq: Three times a day (TID) | ORAL | 0 refills | Status: AC

## 2015-12-21 NOTE — Discharge Summary (Signed)
Physician Discharge Summary  Kelsey Park ZOX:[id number] DOB: Jul 01, 1990 DOA: 12/19/2015  PCP: No primary care provider on file.  Admit date: 12/19/2015 Discharge date: 12/21/2015   Recommendations for Outpatient Follow-Up:   1. Needs PCP in this area   Discharge Diagnosis:   Principal Problem:   Enteritis Active Problems:   Microcytic hypochromic anemia   Hypokalemia   Discharge disposition:  Home  Discharge Condition: Improved.  Diet recommendation:  Regular.  Wound care: None.   History of Present Illness:   Kelsey Park is a 25 y.o. female with history of anemia requiring transfusion previously attributed to severe menorrhagia presents to the ER because of worsening abdominal pain with vomiting. Patient states her symptoms started around 4-5 days ago with nausea vomiting abdominal pain and diarrhea. Diarrhea stopped after the first day. Denies using any recent antibiotics or sick contacts. Patient had continued abdominal pain mostly in the left lower quadrant with vomiting. Pain radiated to her back. In the ER patient was found to have hypokalemia and microcytic hypo-chromic anemia with hemoglobin around 6. No old labs to compare. CT scan of the abdomen and pelvis was done which shows enteritis involving the terminal ileum with gallstones and right ovarian hemorrhagic cyst or corpus luteum. Patient is being admitted for further management.    Hospital Course by Problem:   Abdominal pain with nausea vomiting-  -CT scan shows enteritis involving the terminal ileum.  -no further stools while in hospital -eating and drinking well -HIDA scan normal -Pain could also be from the right hemorrhagic ovarian cyst-- outpatient GYN follow up  Hypokalemiaprobably from vomiting and poor oral intake - replace and recheck.  Microcytic hypochromic anemia- Fe deficiency -IV fe x 1 -transfuse 1 unit PRBC  Tobacco abuse- advised about quitting.    Medical Consultants:     None.   Discharge Exam:   Vitals:   12/20/15 2229 12/21/15 0523  BP: 115/72 107/61  Pulse: 75 (!) 55  Resp: 18 18  Temp: 97.6 F (36.4 C) 98.9 F (37.2 C)   Vitals:   12/20/15 1422 12/20/15 1717 12/20/15 2229 12/21/15 0523  BP: 111/60 (!) 99/49 115/72 107/61  Pulse: 69 69 75 (!) 55  Resp: 20 20 18 18   Temp: 98.1 F (36.7 C) 99.1 F (37.3 C) 97.6 F (36.4 C) 98.9 F (37.2 C)  TempSrc: Oral Oral Oral Oral  SpO2:   100% 100%  Weight:      Height:        Gen:  NAD- eating well, anxious to be d/c'd today   The results of significant diagnostics from this hospitalization (including imaging, microbiology, ancillary and laboratory) are listed below for reference.     Procedures and Diagnostic Studies:   Ct Abdomen Pelvis W Contrast  Result Date: 12/20/2015 CLINICAL DATA:  Abdominal pain onset 4 days ago with nausea, vomiting and lower back pain. EXAM: CT ABDOMEN AND PELVIS WITH CONTRAST TECHNIQUE: Multidetector CT imaging of the abdomen and pelvis was performed using the standard protocol following bolus administration of intravenous contrast. CONTRAST:  ISOVUE-300 IOPAMIDOL (ISOVUE-300) INJECTION 61% COMPARISON:  None. FINDINGS: LOWER CHEST: Lung bases are clear. Included heart size is normal. No pericardial effusion. HEPATOBILIARY: Mild periportal edema. Trace pericholecystic fluid without wall thickening. There appear to be 2 tiny gallstones seen on the sagittal view along the dependent aspect, series 6, image 35. No biliary dilatation. PANCREAS: Normal. SPLEEN: Normal. ADRENALS/URINARY TRACT: Kidneys are orthotopic, demonstrating symmetric enhancement. No nephrolithiasis, hydronephrosis or solid renal masses.  The unopacified ureters are normal in course and caliber. Delayed imaging through the kidneys demonstrates symmetric prompt contrast excretion within the proximal urinary collecting system. Urinary bladder is partially distended and unremarkable. Normal adrenal  glands. STOMACH/BOWEL: The stomach, small and large bowel are normal in course. Mild fluid-filled distal ileal loops may represent mild ileitis/enteritis. Moderate colonic stool burden from cecum through mid transverse colon. No bowel obstruction. Normal appendix. VASCULAR/LYMPHATIC: Aortoiliac vessels are normal in course and caliber. No lymphadenopathy by CT size criteria. REPRODUCTIVE: 2.4 cm enhancing right-sided follicle or cyst possibly a hemorrhagic cyst or corpus luteum. OTHER: Trace free fluid in the pelvis.  No free air. MUSCULOSKELETAL: Nonacute. IMPRESSION: Mild fluid-filled distention of distal and terminal ileum which may reflect a mild enteritis/ ileitis. Cholelithiasis with trace pericholecystic fluid but without wall thickening. Mild periportal edema. If cholecystitis is of clinical concern, HIDA scan may help for further assessment. Right ovarian hemorrhagic cyst or corpus luteum measuring 2.4 cm with trace free fluid in the pelvis. Electronically Signed   By: Tollie Ethavid  Kwon M.D.   On: 12/20/2015 02:28   Nm Hepato W/eject Fract  Result Date: 12/20/2015 CLINICAL DATA:  Left lower abdominal discomfort associated with nausea and vomiting for the past 4-5 days with diarrhea initially. History of gallstones. EXAM: NUCLEAR MEDICINE HEPATOBILIARY IMAGING WITH GALLBLADDER EF TECHNIQUE: Sequential images of the abdomen were obtained out to 60 minutes following intravenous administration of radiopharmaceutical. After oral ingestion of Ensure, gallbladder ejection fraction was determined. At 60 min, normal ejection fraction is greater than 33%. RADIOPHARMACEUTICALS:  5.2 mCi Tc-3463m  Choletec IV COMPARISON:  CT scan of the abdomen dated December 20, 2015 FINDINGS: CT or malfunction prevented visualization of the uptake of the radiopharmaceutical into the liver. However, activity is present within the gallbladder and within bowel. Gallbladder activity is visualized, consistent with patency of cystic duct.  Biliary activity passes into small bowel, consistent with patent common bile duct. Calculated gallbladder ejection fraction is 54%%. (Normal gallbladder ejection fraction with Ensure is greater than 33%.) IMPRESSION: No evidence of cystic duct or common bile duct obstruction. Normal gallbladder ejection fraction of 54%. Electronically Signed   By: David  SwazilandJordan M.D.   On: 12/20/2015 13:54     Labs:   Basic Metabolic Panel:  Recent Labs Lab 12/19/15 2245 12/20/15 0814 12/20/15 1200 12/21/15 0858  NA 137  --  136 136  K 2.9*  --  4.0 3.8  CL 100*  --  104 104  CO2 26  --  25 26  GLUCOSE 103*  --  91 83  BUN 11  --  5* <5*  CREATININE 0.57  --  0.57 0.67  CALCIUM 9.3  --  8.3* 8.9  MG  --  2.1  --   --    GFR Estimated Creatinine Clearance: 79.1 mL/min (by C-G formula based on SCr of 0.67 mg/dL). Liver Function Tests:  Recent Labs Lab 12/19/15 2245  AST 18  ALT 11*  ALKPHOS 44  BILITOT 0.5  PROT 7.6  ALBUMIN 4.7    Recent Labs Lab 12/19/15 2245  LIPASE 15   No results for input(s): AMMONIA in the last 168 hours. Coagulation profile No results for input(s): INR, PROTIME in the last 168 hours.  CBC:  Recent Labs Lab 12/19/15 2245 12/20/15 1200 12/21/15 0858  WBC 7.4 6.7 5.1  NEUTROABS 5.8  --   --   HGB 6.4* REPEATED TO VERIFY 7.1*  HCT 24.3* 22.2* 26.0*  MCV 58.0* 59.0* 61.0*  PLT 468* 306 334   Cardiac Enzymes: No results for input(s): CKTOTAL, CKMB, CKMBINDEX, TROPONINI in the last 168 hours. BNP: Invalid input(s): POCBNP CBG: No results for input(s): GLUCAP in the last 168 hours. D-Dimer No results for input(s): DDIMER in the last 72 hours. Hgb A1c No results for input(s): HGBA1C in the last 72 hours. Lipid Profile No results for input(s): CHOL, HDL, LDLCALC, TRIG, CHOLHDL, LDLDIRECT in the last 72 hours. Thyroid function studies No results for input(s): TSH, T4TOTAL, T3FREE, THYROIDAB in the last 72 hours.  Invalid input(s): FREET3 Anemia  work up  Recent Labs  12/20/15 0814  VITAMINB12 328  FOLATE 14.2  FERRITIN 1*  TIBC 433  IRON 6*  RETICCTPCT 0.6   Microbiology Recent Results (from the past 240 hour(s))  Urine culture     Status: Abnormal (Preliminary result)   Collection Time: 12/19/15 10:30 PM  Result Value Ref Range Status   Specimen Description URINE, RANDOM  Final   Special Requests NONE  Final   Culture (A)  Final    60,000 COLONIES/mL GROUP B STREP(S.AGALACTIAE)ISOLATED TESTING AGAINST S. AGALACTIAE NOT ROUTINELY PERFORMED DUE TO PREDICTABILITY OF AMP/PEN/VAN SUSCEPTIBILITY. CULTURE REINCUBATED FOR BETTER GROWTH [facility]    Report Status PENDING  Incomplete     Discharge Instructions:   Discharge Instructions    Diet general    Complete by:  As directed    Discharge instructions    Complete by:  As directed    Need to establish with PCP-- if not able to tolerate PO iron, will need IV Fe infusions   Increase activity slowly    Complete by:  As directed        Medication List    TAKE these medications   ibuprofen 200 MG tablet Commonly known as:  ADVIL,MOTRIN Take 200 mg by mouth every 6 (six) hours as needed.   metroNIDAZOLE 500 MG tablet Commonly known as:  FLAGYL Take 1 tablet (500 mg total) by mouth every 8 (eight) hours.      Follow-up Information    establish with local PCP who can arrange IV Fe infusions Follow up.            Time coordinating discharge: 35 min  Signed:  Isacc Turney U [place]   [facility] 12/21/2015, 1:38 PM

## 2015-12-21 NOTE — Progress Notes (Signed)
Pt discharged home with friend. Discharge instructions given and all questions answered. Prescription given and instructions understood.

## 2015-12-22 LAB — URINE CULTURE

## 2016-01-11 ENCOUNTER — Emergency Department (HOSPITAL_BASED_OUTPATIENT_CLINIC_OR_DEPARTMENT_OTHER)
Admission: EM | Admit: 2016-01-11 | Discharge: 2016-01-11 | Disposition: A | Discharge status: Home / Self Care | Payer: BLUE CROSS/BLUE SHIELD | Attending: Dermatology | Admitting: Dermatology

## 2016-01-11 ENCOUNTER — Encounter (HOSPITAL_BASED_OUTPATIENT_CLINIC_OR_DEPARTMENT_OTHER): Payer: Self-pay | Admitting: *Deleted

## 2016-01-11 DIAGNOSIS — M549 Dorsalgia, unspecified: Secondary | ICD-10-CM | POA: Insufficient documentation

## 2016-01-11 DIAGNOSIS — F17200 Nicotine dependence, unspecified, uncomplicated: Secondary | ICD-10-CM | POA: Insufficient documentation

## 2016-01-11 DIAGNOSIS — Z5321 Procedure and treatment not carried out due to patient leaving prior to being seen by health care provider: Secondary | ICD-10-CM | POA: Insufficient documentation

## 2016-01-11 DIAGNOSIS — R109 Unspecified abdominal pain: Secondary | ICD-10-CM | POA: Insufficient documentation

## 2016-01-11 NOTE — ED Notes (Signed)
Pt states she does not want to wait to be seen.  

## 2018-03-28 IMAGING — CT CT ABD-PELV W/ CM
2 of 4 series · 15 of 46 positions shown, 17 images · IV contrast (APPLIED)
Comparison: None.

CLINICAL DATA: Abdominal pain onset 4 days ago with nausea,
vomiting and lower back pain.

EXAM:
CT ABDOMEN AND PELVIS WITH CONTRAST
TECHNIQUE: Multidetector CT imaging of the abdomen and pelvis was performed
using the standard protocol following bolus administration of
intravenous contrast.
CONTRAST:  100mL [id number] IOPAMIDOL (F8I42H-T77) INJECTION 61%

[Series 2: axial st · axial · 0.61mm/px · z∈[+315,+730]mm · 12 of 91 slices shown, 14 images]
[im 4/91  soft-tissue]
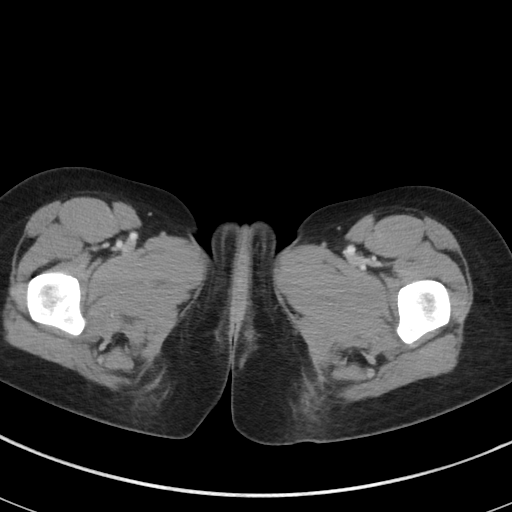
[im 4/91  bone]
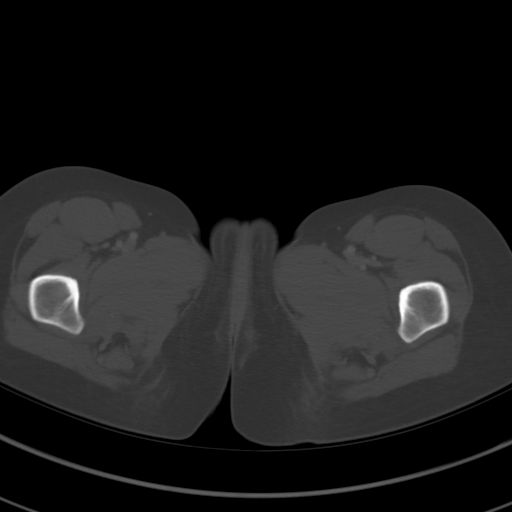
[im 12/91  soft-tissue]
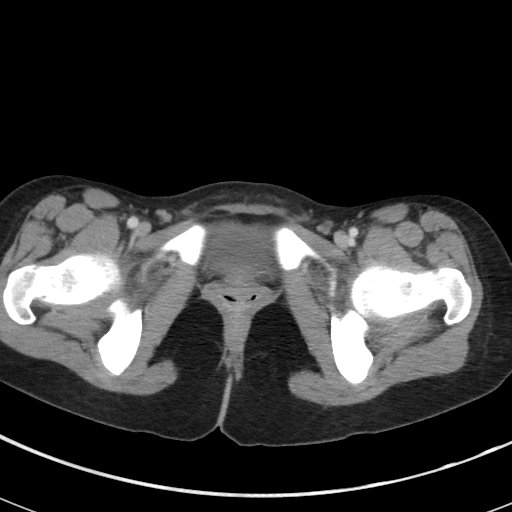
[im 19/91  soft-tissue]
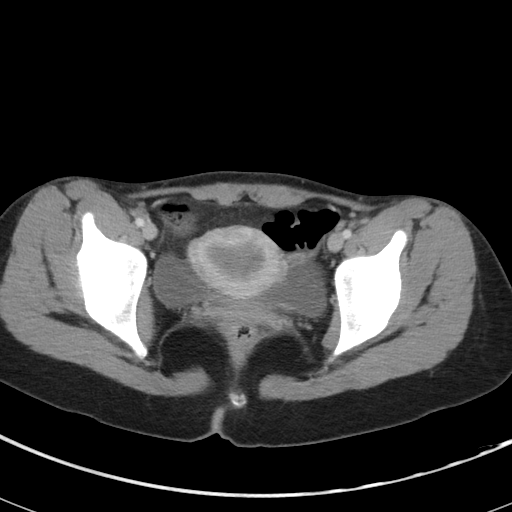
[im 27/91  soft-tissue]
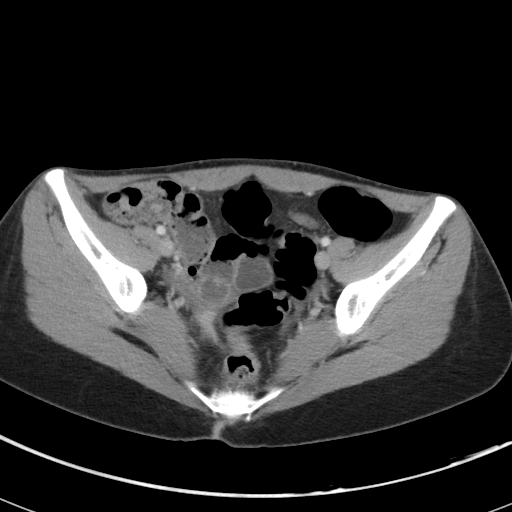
[im 34/91  soft-tissue]
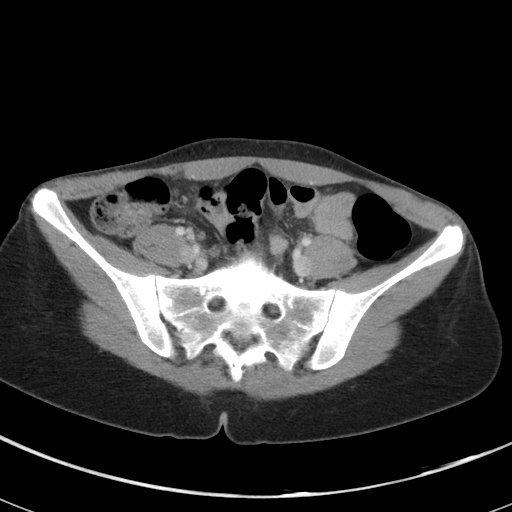
[im 42/91  soft-tissue]
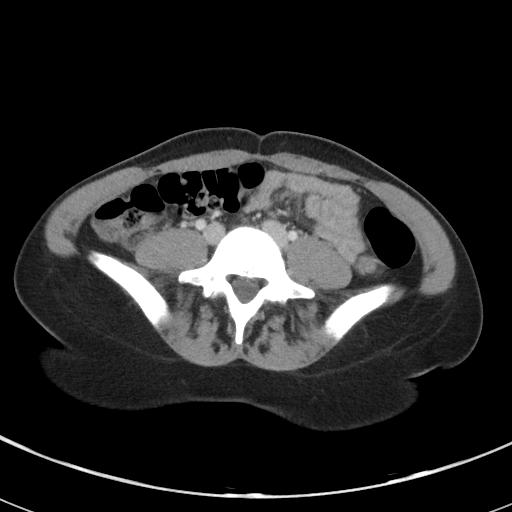
[im 49/91  soft-tissue]
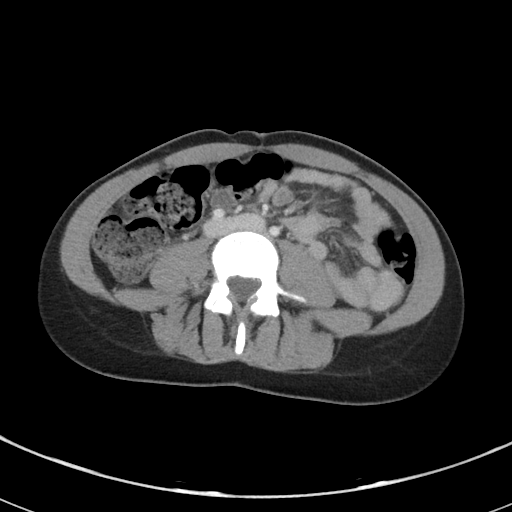
[im 57/91  soft-tissue]
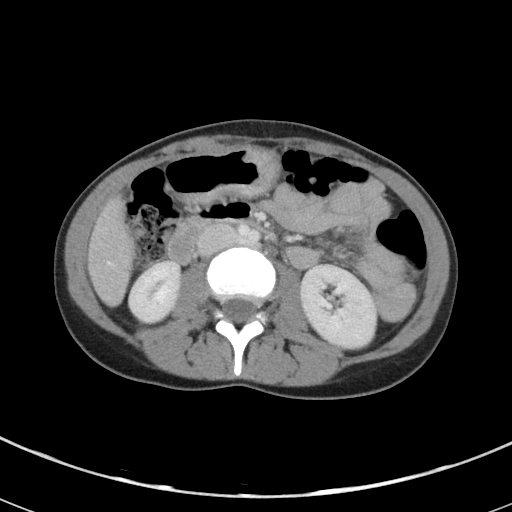
[im 64/91  soft-tissue]
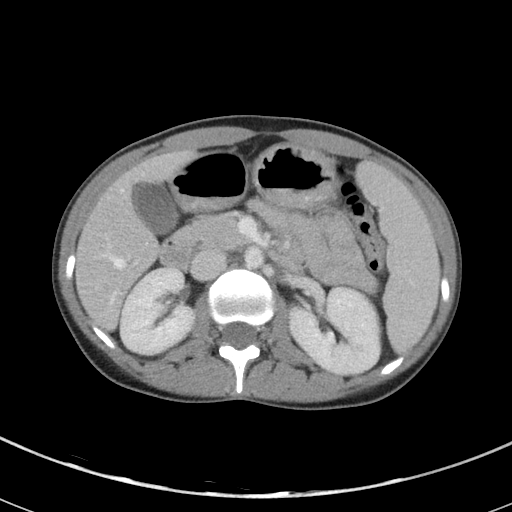
[im 64/91  bone]
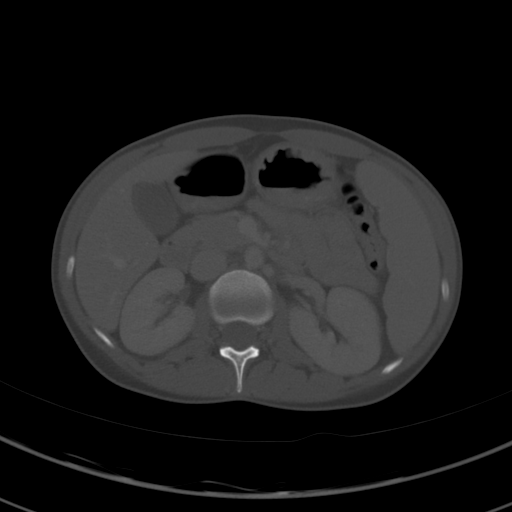
[im 72/91  soft-tissue]
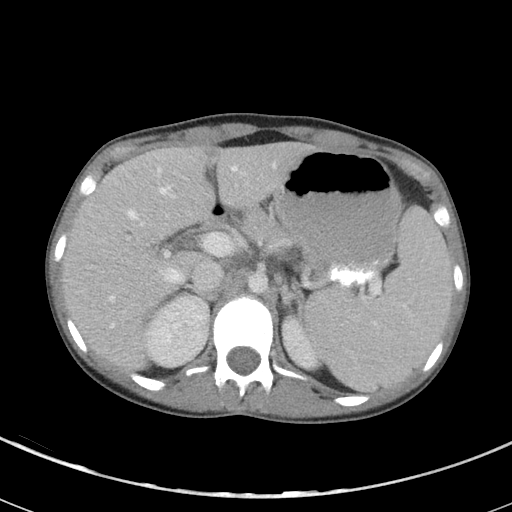
[im 79/91  soft-tissue]
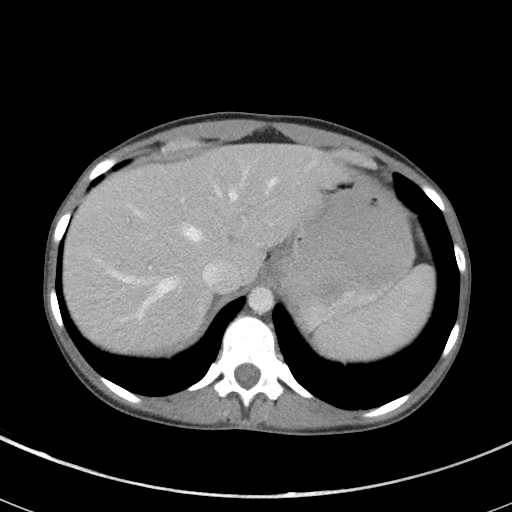
[im 87/91  soft-tissue]
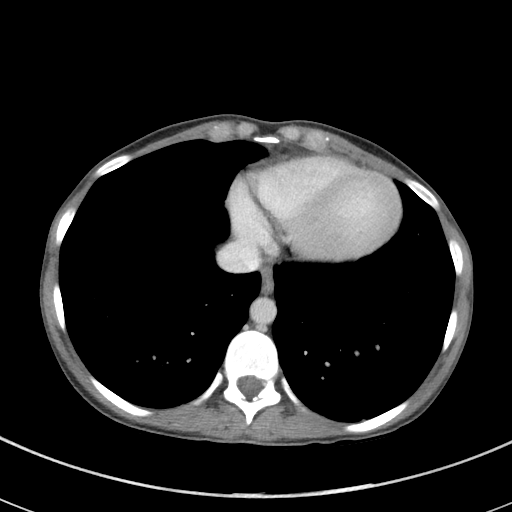

[Series 5: coronal st · coronal · 0.62mm/px · 3 of 73 slices shown]
[im 25/73  soft-tissue]
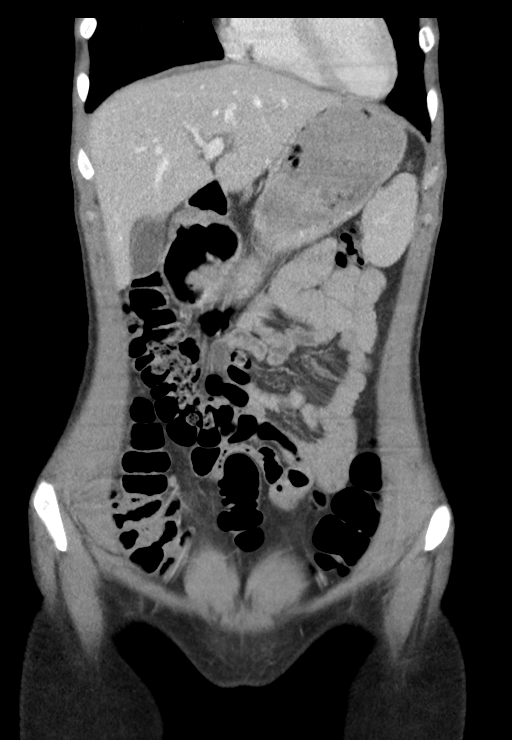
[im 33/73  soft-tissue]
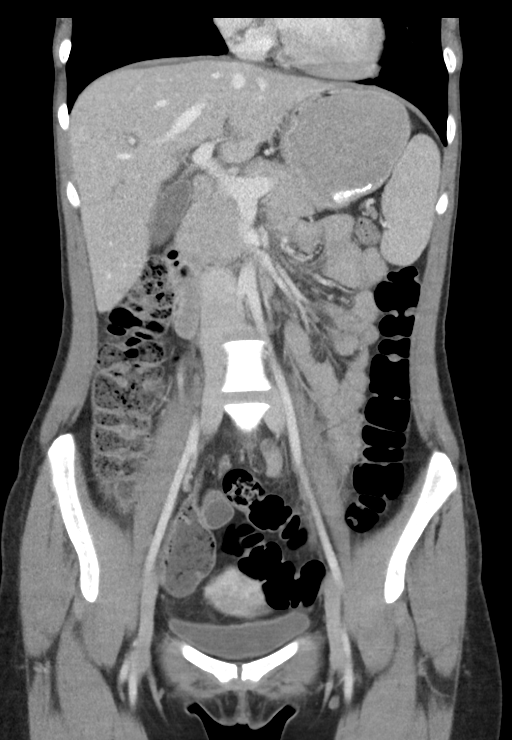
[im 41/73  soft-tissue]
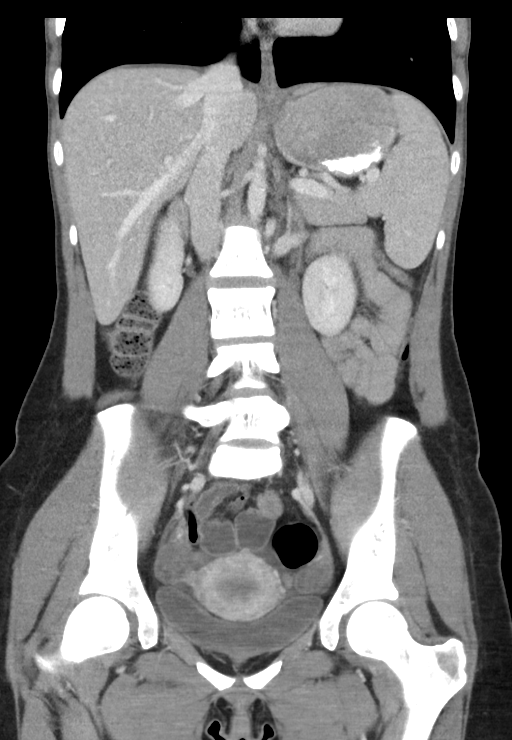

[15 of 46 positions shown; findings below may reference images not displayed]

FINDINGS: LOWER CHEST: Lung bases are clear. Included heart size is normal. No
pericardial effusion.

HEPATOBILIARY: Mild periportal edema. Trace pericholecystic fluid
without wall thickening. There appear to be 2 tiny gallstones seen
on the sagittal view along the dependent aspect, series 6, image 35.
No biliary dilatation.

PANCREAS: Normal.

SPLEEN: Normal.

ADRENALS/URINARY TRACT: Kidneys are orthotopic, demonstrating
symmetric enhancement. No nephrolithiasis, hydronephrosis or solid
renal masses. The unopacified ureters are normal in course and
caliber. Delayed imaging through the kidneys demonstrates symmetric
prompt contrast excretion within the proximal urinary collecting
system. Urinary bladder is partially distended and unremarkable.
Normal adrenal glands.

STOMACH/BOWEL: The stomach, small and large bowel are normal in
course. Mild fluid-filled distal ileal loops may represent mild
ileitis/enteritis. Moderate colonic stool burden from cecum through
mid transverse colon. No bowel obstruction. Normal appendix.

VASCULAR/LYMPHATIC: Aortoiliac vessels are normal in course and
caliber. No lymphadenopathy by CT size criteria.

REPRODUCTIVE: 2.4 cm enhancing right-sided follicle or cyst possibly
a hemorrhagic cyst or corpus luteum.

OTHER: Trace free fluid in the pelvis.  No free air.

MUSCULOSKELETAL: Nonacute.
IMPRESSION: Mild fluid-filled distention of distal and terminal ileum which may
reflect a mild enteritis/ ileitis.

Cholelithiasis with trace pericholecystic fluid but without wall
thickening. Mild periportal edema. If cholecystitis is of clinical
concern, HIDA scan may help for further assessment.

Right ovarian hemorrhagic cyst or corpus luteum measuring 2.4 cm
with trace free fluid in the pelvis.

## 2018-03-28 IMAGING — NM NM HEPATO W/GB/PHARM/[PERSON_NAME]
3 series · 8 of 8 positions shown · non-contrast
Comparison: CT scan of the abdomen dated December 20, 2015

CLINICAL DATA: Left lower abdominal discomfort associated with
nausea and vomiting for the past 4-5 days with diarrhea initially.
History of gallstones.

EXAM:
NUCLEAR MEDICINE HEPATOBILIARY IMAGING WITH GALLBLADDER EF
TECHNIQUE: Sequential images of the abdomen were obtained [DATE] minutes
following intravenous administration of radiopharmaceutical. After
oral ingestion of Ensure, gallbladder ejection fraction was
determined. At 60 min, normal ejection fraction is greater than 33%.
RADIOPHARMACEUTICALS:  5.2 mCi Hc-JJm  Choletec IV

[Series 1: rt lat · 4.14mm/px · 1 of 1 slices shown (1 of 2)]
[im 1/1]
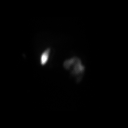

[Series 2: rt lat · 4.14mm/px · 1 of 1 slices shown (2 of 2)]
[im 1/1]
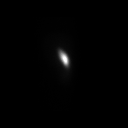

[Series 3: gbef · 4.14mm/px · 6 of 60 frames shown]
[frame 6/60]
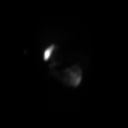
[frame 16/60]
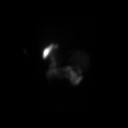
[frame 26/60]
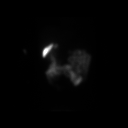
[frame 36/60]
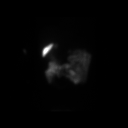
[frame 46/60]
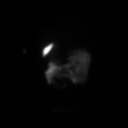
[frame 56/60]
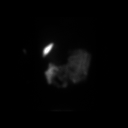

[8 of 8 positions shown; findings below may reference images not displayed]

FINDINGS: CT or malfunction prevented visualization of the uptake of the
radiopharmaceutical into the liver. However, activity is present
within the gallbladder and within bowel.

Gallbladder activity is visualized, consistent with patency of
cystic duct. Biliary activity passes into small bowel, consistent
with patent common bile duct.

Calculated gallbladder ejection fraction is 54%%. (Normal
gallbladder ejection fraction with Ensure is greater than 33%.)
IMPRESSION: No evidence of cystic duct or common bile duct obstruction. Normal
gallbladder ejection fraction of 54%.
